# Patient Record
Sex: Male | Born: 2000 | Race: White | Hispanic: No | Marital: Single | Smoking: Never smoker
Health system: Southern US, Community
[De-identification: ages and names within clinical notes are randomized; demographics above are authoritative.]

## PROBLEM LIST (undated history)

## (undated) HISTORY — PX: CIRCUMCISION: SUR203

---
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
## 2021-06-30 ENCOUNTER — Telehealth: Payer: Self-pay | Admitting: Medical

## 2021-06-30 NOTE — Telephone Encounter (Signed)
Per Melissa ok to schedule with her. It will need to be a 40 minute NP appointment  since he has not been seen since 2018

## 2021-06-30 NOTE — Telephone Encounter (Signed)
Pt's mom was requesting TOC.

## 2021-06-30 NOTE — Telephone Encounter (Signed)
TOC okay ?

## 2021-07-01 NOTE — Telephone Encounter (Signed)
Pt was scheduled 12/19

## 2021-07-06 ENCOUNTER — Ambulatory Visit (INDEPENDENT_AMBULATORY_CARE_PROVIDER_SITE_OTHER): Payer: 59 | Admitting: Family

## 2021-07-06 ENCOUNTER — Encounter: Payer: Self-pay | Admitting: Family

## 2021-07-06 VITALS — BP 130/72 | HR 84 | Temp 98.3°F | Resp 16 | Ht 71.0 in | Wt 180.0 lb

## 2021-07-06 DIAGNOSIS — K2970 Gastritis, unspecified, without bleeding: Secondary | ICD-10-CM

## 2021-07-06 DIAGNOSIS — M25561 Pain in right knee: Secondary | ICD-10-CM | POA: Insufficient documentation

## 2021-07-06 DIAGNOSIS — J029 Acute pharyngitis, unspecified: Secondary | ICD-10-CM

## 2021-07-06 DIAGNOSIS — D169 Benign neoplasm of bone and articular cartilage, unspecified: Secondary | ICD-10-CM | POA: Insufficient documentation

## 2021-07-06 LAB — POC COVID19 BINAXNOW: SARS Coronavirus 2 Ag: NEGATIVE

## 2021-07-06 LAB — POCT RAPID STREP A (OFFICE): Rapid Strep A Screen: POSITIVE — AB

## 2021-07-06 MED ORDER — AMOXICILLIN 500 MG PO CAPS: 500.0000 mg | ORAL_CAPSULE | Freq: Three times a day (TID) | ORAL | 0 refills | Status: AC

## 2021-07-06 NOTE — Assessment & Plan Note (Signed)
Reports that he saw ortho in [place]. Will try to get records from there and get him established with ortho in [place] school.

## 2021-07-06 NOTE — Assessment & Plan Note (Signed)
New. Refer to orthopedics.

## 2021-07-06 NOTE — Progress Notes (Addendum)
Subjective:   By signing my name below, I, Steven Mcdowell, attest that this documentation has been prepared under the direction and in the presence of Debbrah Alar NP. 07/06/2021      Patient ID: Steven Mcdowell, male    DOB: 20-Aug-2000, 20 y.o.   MRN: [id number]  Chief Complaint  Patient presents with   Transitions Of Care    Here for [facility]    HPI Patient is in today for a new patient visit.   Knee pain- He complains of pain under his right knee. He also finds occasional difficulty extending his right leg fully. 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 He reports having more that 6 masses in the area and has them x-rayed.  Stomach pain- He reports having stomach pain on Saturday. He reports eating food Saturday night and feeling full. Vomiting and bowel movements did not help is sensations of fullness and he felt it worsening. He then went to the ER an endoscopy and found he had gastritis and an ulcer. He was given 40 mg omeprazole 2x daily PO to manage his symptoms and found relief. He notes having vivid dreams while taking it. He also notes waking up with sore throat while taking it. He denies having any fullness, issues with eating or drinking at this time.  Vision- He reports having more difficulty seeing. His last vision exam was during his senior year of high school. He currently wears glasses but still finds difficulty seeing with them. He denies having any eye pain or flashing lights in his vision.  Rhinorrhea- He reports developing rhinorrhea recently. He is interested in taking a Covid-19 test during this visit.  Social history- He is currently enrolled in university. He does not use tobacco or vaping products. He drinks 4-12 glasses of alcohol every other weekend. He does not smoke marijuana since his stomach pain started.  Immunizations- He is not interested in receiving the flu vaccines during this visit. He does not have any Covid-19 vaccines at this time.     Health Maintenance Due  Topic Date Due   COVID-19 Vaccine (1) Never done   HPV VACCINES (1 - Male 2-dose series) Never done   Hepatitis C Screening  Never done   Pneumococcal Vaccine 65-45 Years old (1 - PPSV23 if available, else PCV20) 10/02/2019   INFLUENZA VACCINE  Never done    History reviewed. No pertinent past medical history.  Past Surgical History:  Procedure Laterality Date   CIRCUMCISION     IR ANGIOGRAM SELECTIVE EACH ADDITIONAL VESSEL  09/30/2018   IR ANGIOGRAM SELECTIVE EACH ADDITIONAL VESSEL  09/30/2018   IR ANGIOGRAM VISCERAL SELECTIVE  09/30/2018   IR EMBO ART  VEN HEMORR LYMPH EXTRAV  INC GUIDE ROADMAPPING  09/30/2018   IR US GUIDE VASC ACCESS RIGHT  09/30/2018    Family History  Problem Relation Age of Onset   Hypertension Father    Alcohol abuse Father    Basal cell carcinoma Brother    Migraines Maternal Grandfather    Bipolar disorder Maternal Uncle     Social History   Socioeconomic History   Marital status: Single    Spouse name: Not on file   Number of children: Not on file   Years of education: Not on file   Highest education level: Not on file  Occupational History   Not on file  Tobacco Use   Smoking status: Never   Smokeless tobacco: Never  Substance and  Sexual Activity   Alcohol use: Yes    Comment: every other weekend 4-12 drinks/night   Drug use: No   Sexual activity: Never    Birth control/protection: Abstinence  Other Topics Concern   Not on file  Social History Narrative   Goes to [employer]- studying management information systems      Social Determinants of Health   Financial Resource Strain: Not on file  Food Insecurity: Not on file  Transportation Needs: Not on file  Physical Activity: Not on file  Stress: Not on file  Social Connections: Not on file  Intimate Partner Violence: Not on file    Outpatient Medications Prior to Visit  Medication Sig Dispense Refill   acetaminophen (TYLENOL) 500 MG tablet Take 2 tablets  (1,000 mg total) by mouth every 6 (six) hours as needed. 30 tablet 0   omeprazole (PRILOSEC) 40 MG capsule Take 40 mg by mouth 2 (two) times daily.     diphenhydrAMINE (BENADRYL) 25 mg capsule Take 1 capsule (25 mg total) by mouth every 6 (six) hours as needed for itching. 30 capsule 0   traMADol (ULTRAM) 50 MG tablet Take 1 tablet (50 mg total) by mouth every 6 (six) hours as needed (pain not controlled with tylenol). 10 tablet 0   No facility-administered medications prior to visit.    No Known Allergies  Review of Systems  Eyes:  Negative for pain.       (-)flashing lights (+)decreased vision  Gastrointestinal:  Negative for abdominal pain.  Musculoskeletal:        (+)pain under right knee (+)occasional difficulty fully extending right knee       Objective:    Physical Exam Constitutional:      General: He is not in acute distress.    Appearance: Normal appearance. He is not ill-appearing.  HENT:     Head: Normocephalic and atraumatic.     Right Ear: External ear normal.     Left Ear: External ear normal.     Mouth/Throat:     Pharynx: Posterior oropharyngeal erythema present.  Eyes:     Extraocular Movements: Extraocular movements intact.     Pupils: Pupils are equal, round, and reactive to light.  Cardiovascular:     Rate and Rhythm: Normal rate and regular rhythm.     Heart sounds: Normal heart sounds. No murmur heard.   No gallop.  Pulmonary:     Effort: Pulmonary effort is normal. No respiratory distress.     Breath sounds: Normal breath sounds. No wheezing or rales.  Abdominal:     General: Bowel sounds are normal.     Palpations: Abdomen is soft.     Tenderness: There is abdominal tenderness (epigastric).  Musculoskeletal:     Comments: bony mass right upper medial tibia.   Skin:    General: Skin is warm and dry.  Neurological:     Mental Status: He is alert and oriented to person, place, and time.  Psychiatric:        Behavior: Behavior normal.         Judgment: Judgment normal.    BP 130/72 (BP Location: Left Arm, Patient Position: Sitting, Cuff Size: Small)    Pulse 84    Temp 98.3 F (36.8 C) (Oral)    Resp 16    Ht 5\' 11"  (1.803 m)    Wt 180 lb (81.6 kg)    SpO2 98%    BMI 25.10 kg/m  Wt Readings from Last 3 Encounters:  07/06/21 180 lb (81.6 kg)  09/30/18 173 lb 1 oz (78.5 kg) (82 %, Z= 0.91)*  05/02/17 170 lb 12.8 oz (77.5 kg) (87 %, Z= 1.15)*   * Growth percentiles are based on CDC (Boys, 2-20 Years) data.       Assessment & Plan:   Problem List Items Addressed This Visit       Unprioritized   strep pharyngitis    Rapid strep test is +. Will rx with amoxicillin.       Relevant Orders   POCT rapid strep A (Completed)   POC COVID-19 (Completed)   Osteochondroma - Primary    Reports that he saw ortho in [place]. Will try to get records from there and get him established with ortho in [place] near his school.       Relevant Orders   Ambulatory referral to Orthopedics   Gastritis without bleeding    New. This is being managed by GI.  He reports improvement in his symptoms since he began omeprazole.   Discussed importance of limiting his alcohol consumption to no more than 2 drinks/day.       Acute pain of right knee    New. Refer to orthopedics.       Relevant Orders   Ambulatory referral to Orthopedics   49 minutes spent on today's visit. Time was spent reviewing medical record, counseling pt, interviewing patient and formulating medical plan.   Meds ordered this encounter  Medications   amoxicillin (AMOXIL) 500 MG capsule    Sig: Take 1 capsule (500 mg total) by mouth 3 (three) times daily for 10 days.    Dispense:  30 capsule    Refill:  0    Order Specific Question:   Supervising Provider    Answer:   Penni Homans A [[id number]]    I, Debbrah Alar NP, personally preformed the services described in this documentation.  All medical record entries made by the scribe were at my direction and in my presence.  I  have reviewed the chart and discharge instructions (if applicable) and agree that the record reflects my personal performance and is accurate and complete. 07/06/2021   I,Steven Mcdowell,acting as a Education administrator for Nance Pear, NP.,have documented all relevant documentation on the behalf of Nance Pear, NP,as directed by  Nance Pear, NP while in the presence of Nance Pear, NP.   Nance Pear, NP

## 2021-07-06 NOTE — Patient Instructions (Addendum)
Please keep your upcoming appointment with your GI (stomach Dr.).  Start amoxicillin for strep throat.  Throw out your toothbrush in 24 hrs.

## 2021-07-07 DIAGNOSIS — J029 Acute pharyngitis, unspecified: Secondary | ICD-10-CM | POA: Insufficient documentation

## 2021-07-07 DIAGNOSIS — K2970 Gastritis, unspecified, without bleeding: Secondary | ICD-10-CM | POA: Insufficient documentation

## 2021-07-07 NOTE — Assessment & Plan Note (Signed)
Rapid strep test is +. Will rx with amoxicillin.

## 2021-07-07 NOTE — Assessment & Plan Note (Addendum)
New. This is being managed by GI.  He reports improvement in his symptoms since he began omeprazole.   Discussed importance of limiting his alcohol consumption to no more than 2 drinks/day.

## 2021-09-01 DIAGNOSIS — K2950 Unspecified chronic gastritis without bleeding: Secondary | ICD-10-CM | POA: Diagnosis not present

## 2021-09-01 DIAGNOSIS — K2100 Gastro-esophageal reflux disease with esophagitis, without bleeding: Secondary | ICD-10-CM | POA: Diagnosis not present

## 2021-10-06 ENCOUNTER — Encounter: Payer: Medicaid Other | Admitting: Family
